# Patient Record
Sex: Male | Born: 1975 | Hispanic: Yes | Marital: Married | Smoking: Never smoker
Health system: Southern US, Community
[De-identification: ages and names within clinical notes are randomized; demographics above are authoritative.]

---

## 2017-03-06 ENCOUNTER — Emergency Department (HOSPITAL_COMMUNITY): Payer: Self-pay

## 2017-03-06 ENCOUNTER — Emergency Department (HOSPITAL_COMMUNITY)
Admission: EM | Admit: 2017-03-06 | Discharge: 2017-03-06 | Disposition: A | Discharge status: Home / Self Care | Payer: Self-pay | Attending: Emergency Medicine | Admitting: Emergency Medicine

## 2017-03-06 ENCOUNTER — Encounter (HOSPITAL_COMMUNITY): Payer: Self-pay

## 2017-03-06 DIAGNOSIS — M25551 Pain in right hip: Secondary | ICD-10-CM

## 2017-03-06 DIAGNOSIS — M545 Low back pain, unspecified: Secondary | ICD-10-CM

## 2017-03-06 DIAGNOSIS — M25552 Pain in left hip: Secondary | ICD-10-CM | POA: Insufficient documentation

## 2017-03-06 DIAGNOSIS — Y939 Activity, unspecified: Secondary | ICD-10-CM | POA: Insufficient documentation

## 2017-03-06 DIAGNOSIS — Y999 Unspecified external cause status: Secondary | ICD-10-CM | POA: Insufficient documentation

## 2017-03-06 DIAGNOSIS — W19XXXA Unspecified fall, initial encounter: Secondary | ICD-10-CM

## 2017-03-06 DIAGNOSIS — S70211A Abrasion, right hip, initial encounter: Secondary | ICD-10-CM | POA: Insufficient documentation

## 2017-03-06 DIAGNOSIS — W11XXXA Fall on and from ladder, initial encounter: Secondary | ICD-10-CM | POA: Insufficient documentation

## 2017-03-06 DIAGNOSIS — Y929 Unspecified place or not applicable: Secondary | ICD-10-CM | POA: Insufficient documentation

## 2017-03-06 LAB — I-STAT CHEM 8, ED
BUN: 19 mg/dL (ref 6–20)
CREATININE: 1 mg/dL (ref 0.61–1.24)
Calcium, Ion: 1.17 mmol/L (ref 1.15–1.40)
Chloride: 102 mmol/L (ref 101–111)
Glucose, Bld: 76 mg/dL (ref 65–99)
HEMATOCRIT: 44 % (ref 39.0–52.0)
Hemoglobin: 15 g/dL (ref 13.0–17.0)
POTASSIUM: 3.7 mmol/L (ref 3.5–5.1)
SODIUM: 142 mmol/L (ref 135–145)
TCO2: 27 mmol/L (ref 0–100)

## 2017-03-06 MED ORDER — ONDANSETRON HCL 4 MG/2ML IJ SOLN: 4.0000 mg | Freq: Once | INTRAMUSCULAR | Status: DC

## 2017-03-06 MED ORDER — NAPROXEN 250 MG PO TABS: 250.0000 mg | ORAL_TABLET | Freq: Two times a day (BID) | ORAL | 0 refills | Status: AC

## 2017-03-06 MED ORDER — MORPHINE SULFATE (PF) 4 MG/ML IV SOLN: 4.0000 mg | Freq: Once | INTRAVENOUS | Status: DC

## 2017-03-06 MED ORDER — KETOROLAC TROMETHAMINE 30 MG/ML IJ SOLN
30.0000 mg | Freq: Once | INTRAMUSCULAR | Status: AC
  Administered 2017-03-06: 30 mg via INTRAVENOUS
  Filled 2017-03-06: qty 1

## 2017-03-06 NOTE — ED Notes (Signed)
ED Provider at bedside. 

## 2017-03-06 NOTE — ED Provider Notes (Signed)
[facility]-EMERGENCY DEPT Provider Note   CSN: [id number] Arrival date & time: 03/06/17  1546     History   Chief Complaint Chief Complaint  Patient presents with  . Fall    HPI Bradley Lowery is a 41 y.o. male.  Bradley Lowery is a 41 y.o. Male who presents to the ED via EMS after a fall at his job site prior to arrival. Patient was up on a 6 foot ladder when he fell and landed on his back. He reports he also fell onto a nail gun. This did not go off. He complains of pain to his lumbar spine and his bilateral hips. He complains of tingling in his legs and he feels like he is unable to lift his legs on both sides due to pain. He denies hitting his head or loss of consciousness. He denies loss of bowel or bladder control. He denies any abdominal pain, nausea or vomiting. He declines pain medicine at this time. He is not on any anticoagulants. He did not fall onto his feet. He denies fevers, headache, double vision, neck pain, chest pain, shortness of breath, abdominal pain, or other injury.    The history is provided by the patient, medical records and the EMS personnel. No language interpreter was used.  Fall  Pertinent negatives include no chest pain, no abdominal pain, no headaches and no shortness of breath.    History reviewed. No pertinent past medical history.  There are no active problems to display for this patient.   History reviewed. No pertinent surgical history.     Home Medications    Prior to Admission medications   Medication Sig Start Date End Date Taking? Authorizing Provider  naproxen (NAPROSYN) 250 MG tablet Take 1 tablet (250 mg total) by mouth 2 (two) times daily with a meal. 03/06/17   Everlene FarrierWilliam Catalyna Reilly, PA-C    Family History No family history on file.  Social History Social History  Substance Use Topics  . Smoking status: Never Smoker  . Smokeless tobacco: Never Used  . Alcohol use Yes     Comment: social     Allergies   Patient has no known  allergies.   Review of Systems Review of Systems  Constitutional: Negative for chills and fever.  HENT: Negative for congestion and sore throat.   Eyes: Negative for visual disturbance.  Respiratory: Negative for cough and shortness of breath.   Cardiovascular: Negative for chest pain.  Gastrointestinal: Negative for abdominal pain, diarrhea, nausea and vomiting.  Genitourinary: Negative for difficulty urinating and dysuria.  Musculoskeletal: Positive for arthralgias and back pain. Negative for neck pain and neck stiffness.  Skin: Negative for rash and wound.  Neurological: Positive for weakness. Negative for dizziness, syncope, light-headedness, numbness and headaches.     Physical Exam Updated Vital Signs BP 125/83   Pulse 70   Resp 16   Ht 5\' 6"  (1.676 m)   Wt 102.1 kg   SpO2 98%   BMI 36.32 kg/m   Physical Exam  Constitutional: He is oriented to person, place, and time. He appears well-developed and well-nourished. No distress.  Nontoxic appearing.  HENT:  Head: Normocephalic and atraumatic.  Right Ear: External ear normal.  Left Ear: External ear normal.  Mouth/Throat: Oropharynx is clear and moist.  No visible or palpated signs of head injury or trauma.  Eyes: Conjunctivae and EOM are normal. Pupils are equal, round, and reactive to light. Right eye exhibits no discharge. Left eye exhibits no discharge.  Neck:  Normal range of motion. Neck supple. No tracheal deviation present.  Wearing C-collar.   Cardiovascular: Normal rate, regular rhythm, normal heart sounds and intact distal pulses.  Exam reveals no gallop and no friction rub.   No murmur heard. Bilateral radial, posterior tibialis and dorsalis pedis pulses are intact.    Pulmonary/Chest: Effort normal and breath sounds normal. No respiratory distress. He has no wheezes. He has no rales. He exhibits no tenderness.  Lungs clear to auscultation bilaterally. Symmetric chest expansion bilaterally.  Abdominal: Soft.  There is no tenderness. There is no guarding.  Abdomen is soft and nontender to palpation.  Musculoskeletal: He exhibits tenderness. He exhibits no edema or deformity.  There is a small superficial, nonbleeding abrasion noted to his right lateral hip. He has some tenderness to his bilateral hips without any pelvic instability. No long bone tenderness to his lower extremities. No clavicle tenderness bilaterally. No cervical or thoracic spinal tenderness palpation. Patient has midline tenderness to his lumbar spine. No crepitus. No deformity. No overlying skin changes to his back. No tenderness noted to his bilateral feet or ankles.  Neurological: He is alert and oriented to person, place, and time. No cranial nerve deficit or sensory deficit. He exhibits normal muscle tone. Coordination normal.  Patient attempts to lift legs bilaterally, but reports is unable to due to pain to his hips. He has good strength in plantar and dorsiflexion bilaterally. Good and equal grip strength bilaterally.  Skin: Skin is warm and dry. Capillary refill takes less than 2 seconds. No rash noted. He is not diaphoretic. No erythema. No pallor.  Psychiatric: He has a normal mood and affect. His behavior is normal.  Nursing note and vitals reviewed.    ED Treatments / Results  Labs (all labs ordered are listed, but only abnormal results are displayed) Labs Reviewed  I-STAT CHEM 8, ED    EKG  EKG Interpretation None       Radiology Dg Thoracic Spine W/swimmers  Result Date: 03/06/2017 CLINICAL DATA:  Pain following fall EXAM: THORACIC SPINE - 3 VIEWS COMPARISON:  None. FINDINGS: Frontal, lateral, and swimmer's views obtained. There is no evident fracture or spondylolisthesis. The disc spaces appear normal. No erosive change or paraspinous lesion. IMPRESSION: No fracture or spondylolisthesis.  No evident arthropathic change. Electronically Signed   By: Bretta Bang III M.D.   On: 03/06/2017 16:37   Dg Lumbar  Spine Complete  Result Date: 03/06/2017 CLINICAL DATA:  Acute low back pain following fall from ladder today. Initial encounter. EXAM: LUMBAR SPINE - COMPLETE 4+ VIEW COMPARISON:  None. FINDINGS: There is no evidence of lumbar spine fracture. Alignment is normal. Intervertebral disc spaces are maintained. IMPRESSION: Negative. Electronically Signed   By: Harmon Pier M.D.   On: 03/06/2017 16:34   Ct Cervical Spine Wo Contrast  Result Date: 03/06/2017 CLINICAL DATA:  Patient fell 6 feet today.  Tingling. EXAM: CT CERVICAL SPINE WITHOUT CONTRAST TECHNIQUE: Multidetector CT imaging of the cervical spine was performed without intravenous contrast. Multiplanar CT image reconstructions were also generated. COMPARISON:  None. FINDINGS: Alignment: Mild straightening of normal cervical lordosis. No subluxation. Facets are well aligned bilaterally. Skull base and vertebrae: No fracture from the skullbase to the T3 vertebral body. Soft tissues and spinal canal: No prevertebral fluid or swelling. No visible canal hematoma. Disc levels:  Intervertebral disc spaces are preserved. Upper chest: Compressive atelectasis in the dependent lungs bilaterally. Other: None. IMPRESSION: No evidence for an acute fracture in the cervical spine. Loss  of cervical lordosis. This can be related to patient positioning, muscle spasm or soft tissue injury. Electronically Signed   By: Kennith Center M.D.   On: 03/06/2017 17:43   Dg Hips Bilat W Or Wo Pelvis 3-4 Views  Result Date: 03/06/2017 CLINICAL DATA:  Pain following fall EXAM: DG HIP (WITH OR WITHOUT PELVIS) 3-4V BILAT COMPARISON:  None. FINDINGS: Frontal pelvis as well as frontal and lateral views of each hip -total five views - obtained. There is no fracture or dislocation. Joint spaces appear normal. No erosive change. IMPRESSION: No fracture or dislocation.  No evident arthropathy. Electronically Signed   By: Bretta Bang III M.D.   On: 03/06/2017 16:33     Procedures Procedures (including critical care time)  Medications Ordered in ED Medications  ketorolac (TORADOL) 30 MG/ML injection 30 mg (30 mg Intravenous Given 03/06/17 1949)     Initial Impression / Assessment and Plan / ED Course  I have reviewed the triage vital signs and the nursing notes.  Pertinent labs & imaging results that were available during my care of the patient were reviewed by me and considered in my medical decision making (see chart for details).    This  is a 41 y.o. Male who presents to the ED via EMS after a fall at his job site prior to arrival. Patient was up on a 6 foot ladder when he fell and landed on his back. He reports he also fell onto a nail gun. This did not go off. He complains of pain to his lumbar spine and his bilateral hips. He complains of tingling in his legs and he feels like he is unable to lift his legs on both sides due to pain. He denies hitting his head or loss of consciousness. He denies loss of bowel or bladder control. He denies any abdominal pain, nausea or vomiting. He declines pain medicine at this time. He is not on any anticoagulants.  On exam the patient is afebrile nontoxic appearing. He is wearing c-collar placed by EMS. He has tenderness to his lumbar spine in the midline. No crepitus or deformity. He also has a small abrasion to his right lateral hip that is superficial nonbleeding. His tenderness overlying his bilateral lateral hips. He reports feeling weak in his bilateral lower extremity is but is able to plantar dorsiflexion lift his leg slightly bilaterally. He plans of pain to his hips with movement of his legs. Sensation is intact. He has no focal neurological deficits on exam.  CT of his cervical spine, and x-rays of his lumbar spine, thoracic spine and bilateral hips were all without acute findings. After toradol the patient ambulated without difficulty. He reports his pain is improved and his tingling has resolved. Will  discharge at this time with protrusion for naproxen and have him follow-up with primary care. Discussed return precautions. I advised the patient to follow-up with their primary care provider this week. I advised the patient to return to the emergency department with new or worsening symptoms or new concerns. The patient and his wife verbalized understanding and agreement with plan.     Final Clinical Impressions(s) / ED Diagnoses   Final diagnoses:  Acute bilateral low back pain without sciatica  Fall from ladder, initial encounter  Bilateral hip pain    New Prescriptions New Prescriptions   NAPROXEN (NAPROSYN) 250 MG TABLET    Take 1 tablet (250 mg total) by mouth 2 (two) times daily with a meal.  Everlene Farrier, PA-C 03/06/17 2110    Maia Plan, MD 03/07/17 1100

## 2017-03-06 NOTE — ED Triage Notes (Signed)
Pt BIB [facility] from job site where pt had a fall from a 6 ft. Ladder. Per EMS pt landed on a nail gun, nail gun did not go off. Pt c/o pain in lumbar spine on palpation with numbness and tingling in legs. Pt reports not being able to lift legs bilaterally. Denies LOC or hitting head. Pt a&oX4.

## 2017-03-06 NOTE — ED Notes (Signed)
Patient transported to X-ray 

## 2017-03-06 NOTE — ED Notes (Signed)
Pt was able to ambulate without assistance. He stated he felt a little "stiff" but other than that he felt "ok walking." Denied dizziness/lightheadedness etc. Pt tolerated well.

## 2018-09-03 IMAGING — CT CT CERVICAL SPINE W/O CM
4 series · 17 of 33 positions shown, 20 images · non-contrast
Comparison: None.

CLINICAL DATA: Patient fell 6 feet today.  Tingling.

EXAM:
CT CERVICAL SPINE WITHOUT CONTRAST
TECHNIQUE: Multidetector CT imaging of the cervical spine was performed without
intravenous contrast. Multiplanar CT image reconstructions were also
generated.

[Series 202: soft tissue, idose (2) · axial · 0.32mm/px · z∈[+753,+863]mm · 4 of 111 slices shown]
[im 19/111  soft-tissue]
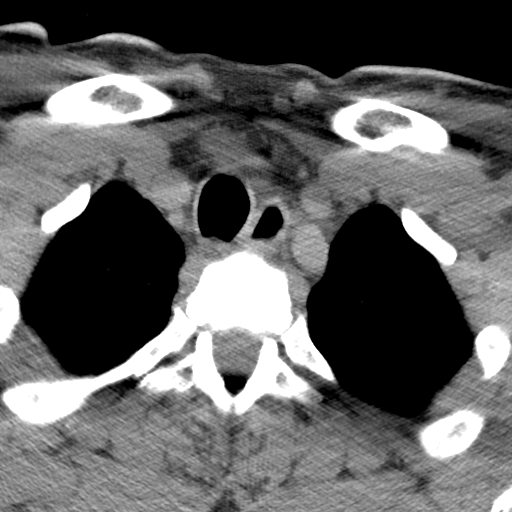
[im 37/111  soft-tissue]
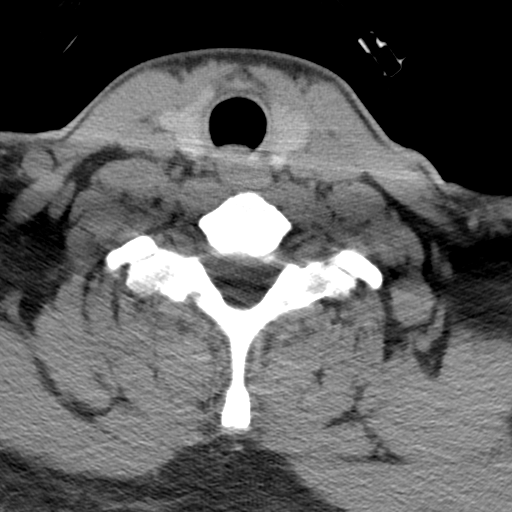
[im 56/111  soft-tissue]
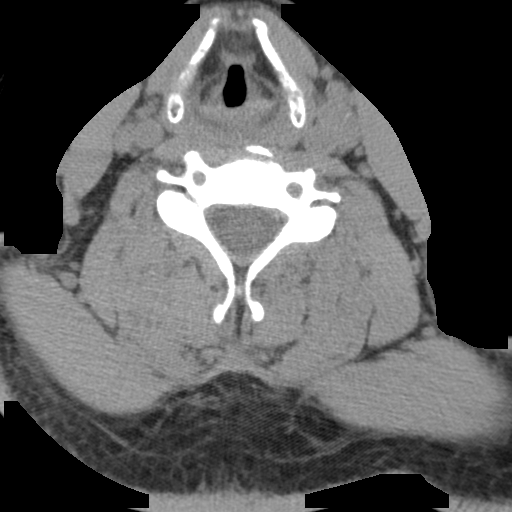
[im 74/111  soft-tissue]
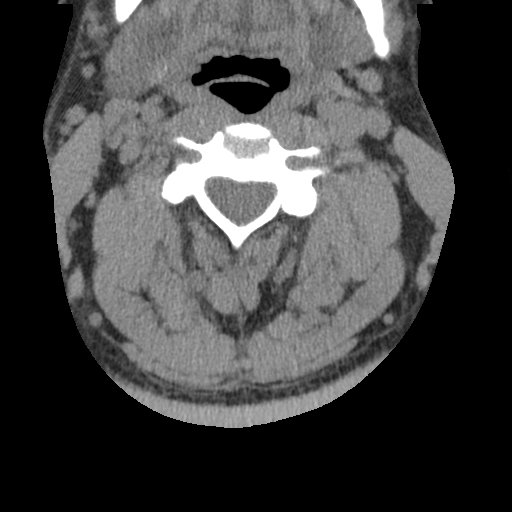

[Series 204: coronal, idose (2) · coronal · 0.35mm/px · 3 of 83 slices shown]
[im 17/83  bone]
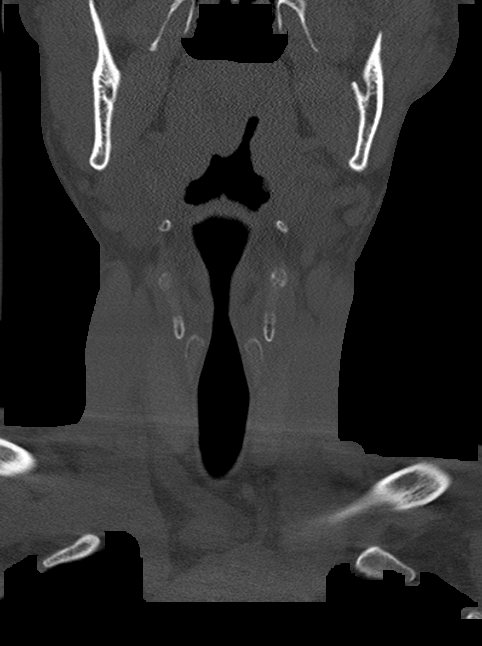
[im 33/83  bone]
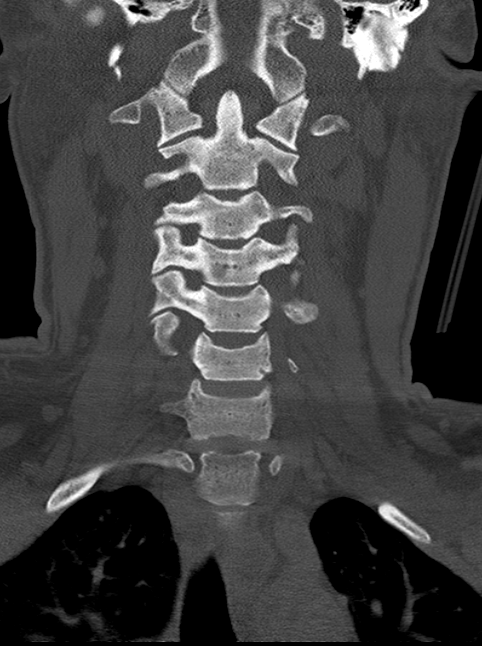
[im 50/83  bone]
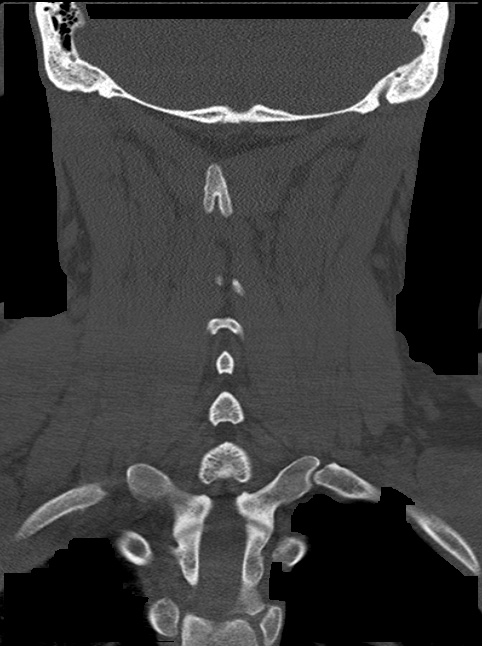

[Series 205: sagittal, idose (2) · sagittal · 0.34mm/px · 5 of 83 slices shown, 6 images]
[im 28/83  bone]
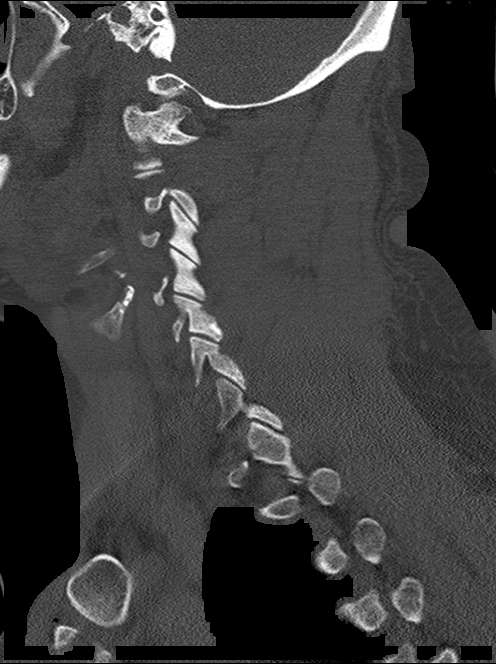
[im 35/83  bone]
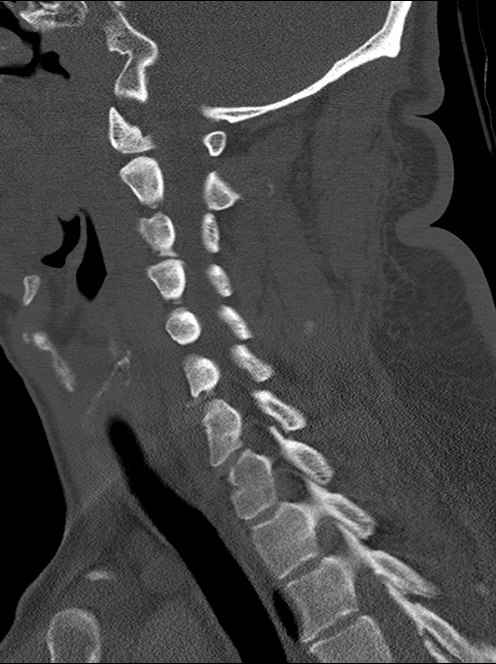
[im 42/83  soft-tissue]
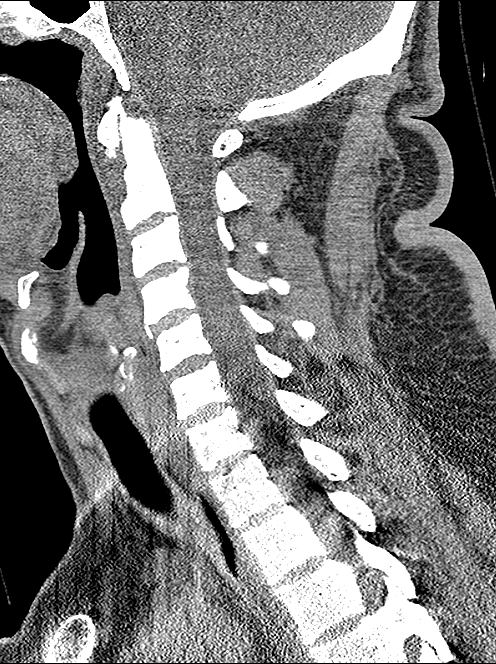
[im 42/83  bone]
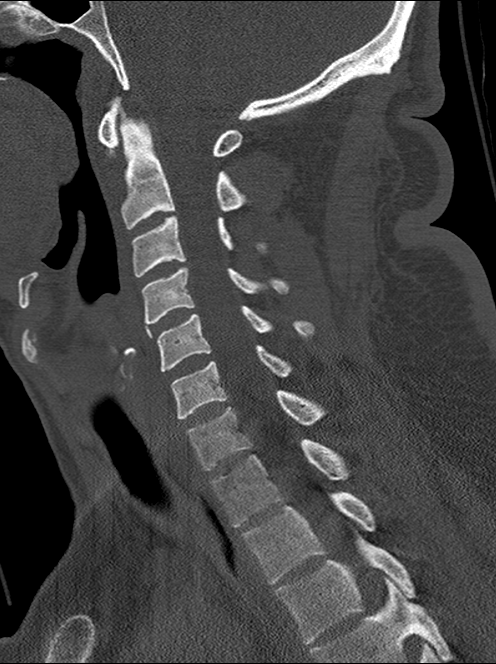
[im 48/83  bone]
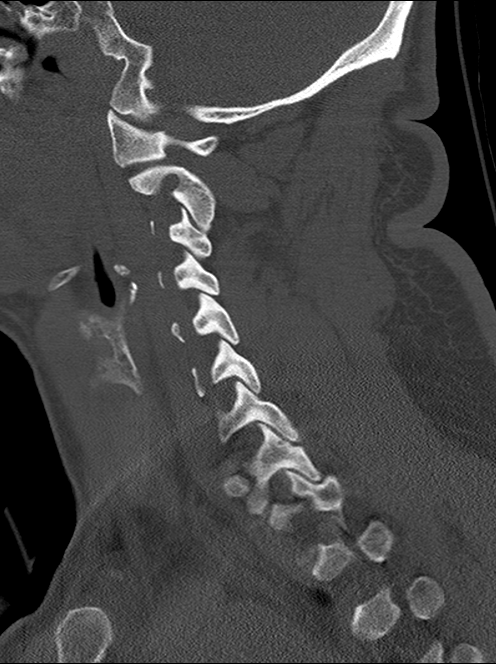
[im 55/83  bone]
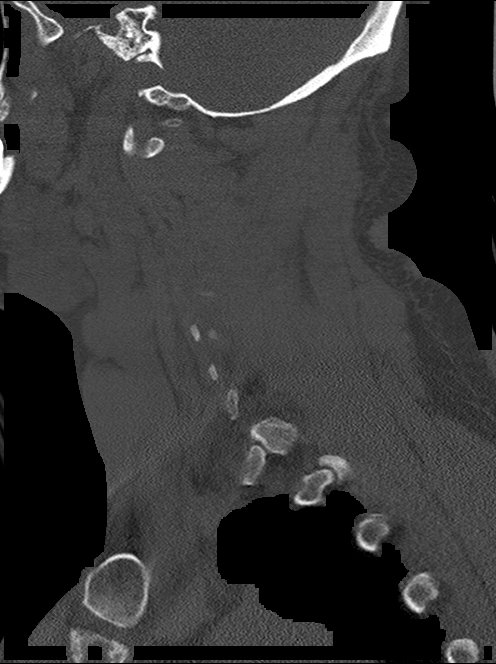

[Series 206: orthogonals, idose (2) · axial · 0.42mm/px · z∈[+736,+878]mm · 5 of 111 slices shown, 7 images]
[im 19/111  soft-tissue]
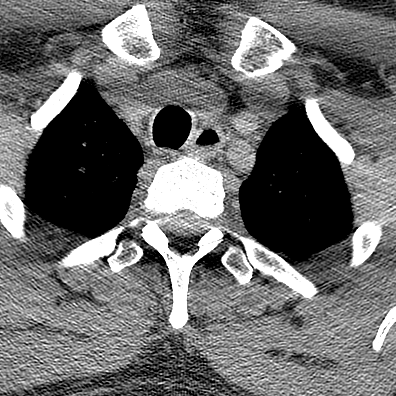
[im 19/111  bone]
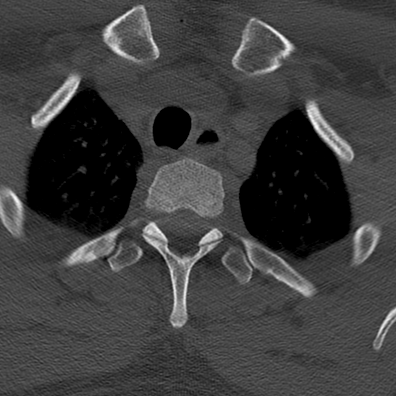
[im 37/111  bone]
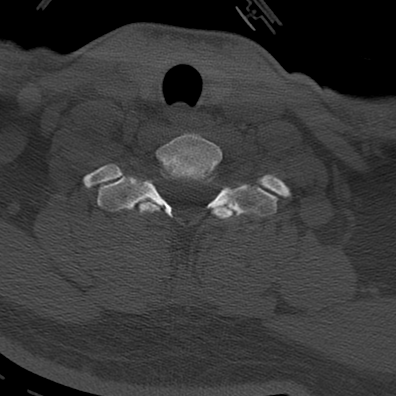
[im 56/111  bone]
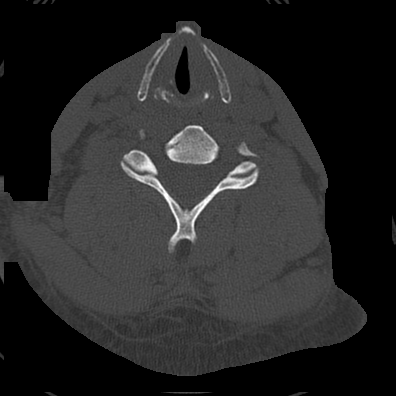
[im 74/111  bone]
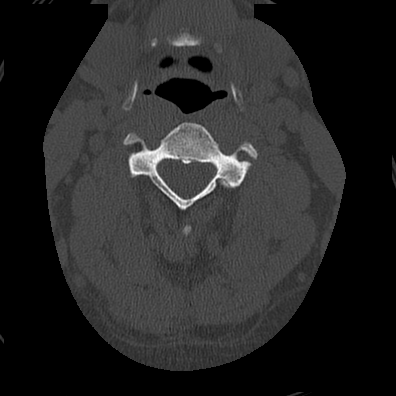
[im 92/111  soft-tissue]
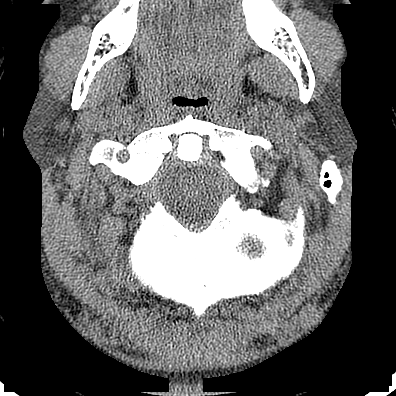
[im 92/111  bone]
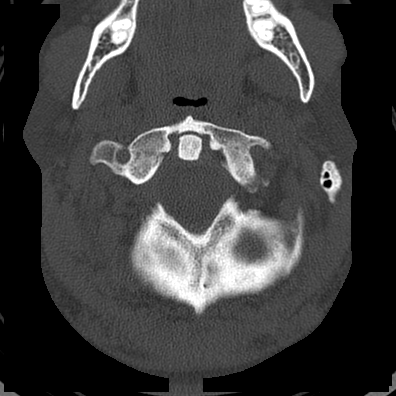

[17 of 33 positions shown; findings below may reference images not displayed]

FINDINGS: Alignment: Mild straightening of normal cervical lordosis. No
subluxation. Facets are well aligned bilaterally.

Skull base and vertebrae: No fracture from the skullbase to the T3
vertebral body.

Soft tissues and spinal canal: No prevertebral fluid or swelling. No
visible canal hematoma.

Disc levels:  Intervertebral disc spaces are preserved.

Upper chest: Compressive atelectasis in the dependent lungs
bilaterally.

Other: None.
IMPRESSION: No evidence for an acute fracture in the cervical spine.

Loss of cervical lordosis. This can be related to patient
positioning, muscle spasm or soft tissue injury.
